# Patient Record
Sex: Female | Born: 1960 | Race: White | Hispanic: No | Marital: Married | Smoking: Current every day smoker
Health system: Southern US, Community
[De-identification: ages and names within clinical notes are randomized; demographics above are authoritative.]

## PROBLEM LIST (undated history)

## (undated) DIAGNOSIS — R05 Cough: Secondary | ICD-10-CM

## (undated) DIAGNOSIS — M549 Dorsalgia, unspecified: Secondary | ICD-10-CM

## (undated) DIAGNOSIS — K219 Gastro-esophageal reflux disease without esophagitis: Secondary | ICD-10-CM

## (undated) DIAGNOSIS — J302 Other seasonal allergic rhinitis: Secondary | ICD-10-CM

## (undated) DIAGNOSIS — R059 Cough, unspecified: Secondary | ICD-10-CM

## (undated) HISTORY — DX: Cough: R05

## (undated) HISTORY — DX: Gastro-esophageal reflux disease without esophagitis: K21.9

## (undated) HISTORY — PX: NO PAST SURGERIES: SHX2092

## (undated) HISTORY — DX: Other seasonal allergic rhinitis: J30.2

## (undated) HISTORY — DX: Dorsalgia, unspecified: M54.9

## (undated) HISTORY — DX: Cough, unspecified: R05.9

---

## 2003-09-16 ENCOUNTER — Other Ambulatory Visit: Admission: RE | Admit: 2003-09-16 | Discharge: 2003-09-16 | Payer: Self-pay | Admitting: Family Medicine

## 2003-10-14 ENCOUNTER — Ambulatory Visit (HOSPITAL_COMMUNITY): Admission: RE | Admit: 2003-10-14 | Discharge: 2003-10-14 | Payer: Self-pay | Admitting: Family Medicine

## 2005-04-01 ENCOUNTER — Ambulatory Visit (HOSPITAL_COMMUNITY): Admission: RE | Admit: 2005-04-01 | Discharge: 2005-04-01 | Payer: Self-pay | Admitting: Obstetrics and Gynecology

## 2005-04-22 ENCOUNTER — Other Ambulatory Visit: Admission: RE | Admit: 2005-04-22 | Discharge: 2005-04-22 | Payer: Self-pay | Admitting: Family Medicine

## 2006-04-28 ENCOUNTER — Other Ambulatory Visit: Admission: RE | Admit: 2006-04-28 | Discharge: 2006-04-28 | Payer: Self-pay | Admitting: Family Medicine

## 2006-05-19 ENCOUNTER — Ambulatory Visit (HOSPITAL_COMMUNITY): Admission: RE | Admit: 2006-05-19 | Discharge: 2006-05-19 | Payer: Self-pay | Admitting: Family Medicine

## 2007-05-04 ENCOUNTER — Other Ambulatory Visit: Admission: RE | Admit: 2007-05-04 | Discharge: 2007-05-04 | Payer: Self-pay | Admitting: Family Medicine

## 2007-05-25 ENCOUNTER — Ambulatory Visit (HOSPITAL_COMMUNITY): Admission: RE | Admit: 2007-05-25 | Discharge: 2007-05-25 | Payer: Self-pay | Admitting: Family Medicine

## 2008-05-31 ENCOUNTER — Emergency Department (HOSPITAL_BASED_OUTPATIENT_CLINIC_OR_DEPARTMENT_OTHER): Admission: EM | Admit: 2008-05-31 | Discharge: 2008-05-31 | Payer: Self-pay | Admitting: Emergency Medicine

## 2008-12-15 ENCOUNTER — Other Ambulatory Visit: Admission: RE | Admit: 2008-12-15 | Discharge: 2008-12-15 | Payer: Self-pay | Admitting: Family Medicine

## 2008-12-27 ENCOUNTER — Ambulatory Visit (HOSPITAL_COMMUNITY): Admission: RE | Admit: 2008-12-27 | Discharge: 2008-12-27 | Payer: Self-pay | Admitting: Family Medicine

## 2010-01-10 ENCOUNTER — Other Ambulatory Visit: Admission: RE | Admit: 2010-01-10 | Discharge: 2010-01-10 | Payer: Self-pay | Admitting: Family Medicine

## 2010-01-19 ENCOUNTER — Ambulatory Visit (HOSPITAL_COMMUNITY): Admission: RE | Admit: 2010-01-19 | Discharge: 2010-01-19 | Payer: Self-pay | Admitting: Family Medicine

## 2010-09-02 ENCOUNTER — Encounter: Payer: Self-pay | Admitting: Obstetrics and Gynecology

## 2011-01-01 ENCOUNTER — Other Ambulatory Visit (HOSPITAL_COMMUNITY): Payer: Self-pay | Admitting: Family Medicine

## 2011-01-01 DIAGNOSIS — Z1231 Encounter for screening mammogram for malignant neoplasm of breast: Secondary | ICD-10-CM

## 2011-01-31 ENCOUNTER — Ambulatory Visit (HOSPITAL_COMMUNITY)
Admission: RE | Admit: 2011-01-31 | Discharge: 2011-01-31 | Disposition: A | Discharge status: Home / Self Care | Payer: PRIVATE HEALTH INSURANCE | Source: Ambulatory Visit | Attending: Family Medicine | Admitting: Family Medicine

## 2011-01-31 DIAGNOSIS — Z1231 Encounter for screening mammogram for malignant neoplasm of breast: Secondary | ICD-10-CM | POA: Insufficient documentation

## 2012-02-03 ENCOUNTER — Other Ambulatory Visit (HOSPITAL_COMMUNITY): Payer: Self-pay | Admitting: Family Medicine

## 2012-02-03 DIAGNOSIS — Z1231 Encounter for screening mammogram for malignant neoplasm of breast: Secondary | ICD-10-CM

## 2012-02-27 ENCOUNTER — Ambulatory Visit (HOSPITAL_COMMUNITY)
Admission: RE | Admit: 2012-02-27 | Discharge: 2012-02-27 | Disposition: A | Discharge status: Home / Self Care | Payer: PRIVATE HEALTH INSURANCE | Source: Ambulatory Visit | Attending: Family Medicine | Admitting: Family Medicine

## 2012-02-27 DIAGNOSIS — Z1231 Encounter for screening mammogram for malignant neoplasm of breast: Secondary | ICD-10-CM | POA: Insufficient documentation

## 2013-02-01 ENCOUNTER — Other Ambulatory Visit: Payer: Self-pay | Admitting: Family Medicine

## 2013-02-01 ENCOUNTER — Other Ambulatory Visit (HOSPITAL_COMMUNITY): Payer: Self-pay | Admitting: Family Medicine

## 2013-02-01 ENCOUNTER — Other Ambulatory Visit (HOSPITAL_COMMUNITY)
Admission: RE | Admit: 2013-02-01 | Discharge: 2013-02-01 | Disposition: A | Discharge status: Home / Self Care | Payer: PRIVATE HEALTH INSURANCE | Source: Ambulatory Visit | Attending: Family Medicine | Admitting: Family Medicine

## 2013-02-01 DIAGNOSIS — Z1231 Encounter for screening mammogram for malignant neoplasm of breast: Secondary | ICD-10-CM

## 2013-02-01 DIAGNOSIS — Z01419 Encounter for gynecological examination (general) (routine) without abnormal findings: Secondary | ICD-10-CM | POA: Insufficient documentation

## 2013-02-19 ENCOUNTER — Other Ambulatory Visit: Payer: Self-pay | Admitting: Surgery

## 2013-03-01 ENCOUNTER — Ambulatory Visit (HOSPITAL_COMMUNITY)
Admission: RE | Admit: 2013-03-01 | Discharge: 2013-03-01 | Disposition: A | Discharge status: Home / Self Care | Payer: PRIVATE HEALTH INSURANCE | Source: Ambulatory Visit | Attending: Family Medicine | Admitting: Family Medicine

## 2013-03-01 DIAGNOSIS — Z1231 Encounter for screening mammogram for malignant neoplasm of breast: Secondary | ICD-10-CM | POA: Insufficient documentation

## 2013-11-29 ENCOUNTER — Ambulatory Visit
Admission: RE | Admit: 2013-11-29 | Discharge: 2013-11-29 | Disposition: A | Discharge status: Home / Self Care | Payer: PRIVATE HEALTH INSURANCE | Source: Ambulatory Visit | Attending: Family Medicine | Admitting: Family Medicine

## 2013-11-29 ENCOUNTER — Other Ambulatory Visit: Payer: Self-pay | Admitting: Family Medicine

## 2013-11-29 DIAGNOSIS — R05 Cough: Secondary | ICD-10-CM

## 2013-11-29 DIAGNOSIS — R053 Chronic cough: Secondary | ICD-10-CM

## 2013-12-16 ENCOUNTER — Encounter: Payer: Self-pay | Admitting: *Deleted

## 2013-12-17 ENCOUNTER — Encounter: Payer: Self-pay | Admitting: Pulmonary Disease

## 2013-12-17 ENCOUNTER — Ambulatory Visit (INDEPENDENT_AMBULATORY_CARE_PROVIDER_SITE_OTHER): Payer: No Typology Code available for payment source | Admitting: Pulmonary Disease

## 2013-12-17 VITALS — BP 130/80 | HR 82 | Temp 98.1°F | Ht 65.0 in | Wt 126.4 lb

## 2013-12-17 DIAGNOSIS — R053 Chronic cough: Secondary | ICD-10-CM

## 2013-12-17 DIAGNOSIS — R059 Cough, unspecified: Secondary | ICD-10-CM

## 2013-12-17 DIAGNOSIS — R05 Cough: Secondary | ICD-10-CM | POA: Insufficient documentation

## 2013-12-17 NOTE — Patient Instructions (Addendum)
Take chlorpheniramine 4mg  tabs, 2 each night at bedtime. Increase omeprazole to 40mg  one in am AND pm everyday.  Take 30min before meals. Will treat with a short course of prednisone for 8 days. Stay on qvar but use 2 inhalations each am AND pm.  Rinse well after using.  You must stop smoking if you want to stay well.

## 2013-12-17 NOTE — Progress Notes (Signed)
   Subjective:    Patient ID: Kendra Olson, female    DOB: 1961/01/31, 53 y.o.   MRN: [id number]  HPI The patient is a 53 year old female who I've been asked to see for persistent cough. The patient has a long-standing history of smoking, and has not had recent spirometry. She has had a recent chest x-ray last month that was without acute process. She tells me that she typically will develop increasing sinus symptoms sinus and chest congestion and cough starting every November, and this will typically go through to March. This year, she began to have the same symptoms in January, and has been treated with antibiotics in January and March. Her symptoms would improve after taking the antibiotics, and then would return. The patient feels congestion in her sinuses and chest, and also has had sore throat. Her tickling cough starts near bedtime, and then she can awaken throughout the night with a cough productive of large quantities of nonpurulent mucus.  She is unsure whether the mucus is coming from her sinuses and draining down, or coming from her chest. She has very little cough during the day with only scant mucus. She currently has no postnasal drip or sinus pressure. She does have some shortness of breath with heavy exertion over the last few weeks. She has been treated for reflux with once a day proton pump inhibitor, and initially had some improvement, she denies any heartburn symptoms at this time. She was seen by otolaryngology in January of this year, and apparently had a CT of her sinuses that was unremarkable. She also had allergy testing in 2013 that was unremarkable according to the patient. She has been on Qvar for the last month, and has seen no change.   Review of Systems  Constitutional: Negative for fever and unexpected weight change.  HENT: Positive for congestion, sneezing and sore throat. Negative for dental problem, ear pain, nosebleeds, postnasal drip, rhinorrhea, sinus pressure and  trouble swallowing.   Eyes: Negative for redness and itching.  Respiratory: Positive for cough and shortness of breath. Negative for chest tightness and wheezing.   Cardiovascular: Negative for palpitations and leg swelling.  Gastrointestinal: Negative for nausea and vomiting.  Genitourinary: Negative for dysuria.  Musculoskeletal: Negative for joint swelling.  Skin: Negative for rash.  Neurological: Negative for headaches.  Hematological: Does not bruise/bleed easily.  Psychiatric/Behavioral: Negative for dysphoric mood. The patient is not nervous/anxious.        Objective:   Physical Exam Constitutional:  Well developed, no acute distress  HENT:  Nares patent without discharge, mildly erythematous  Oropharynx without exudate, palate and uvula are normal  Eyes:  Perrla, eomi, no scleral icterus  Neck:  No JVD, no TMG  Cardiovascular:  Normal rate, regular rhythm, no rubs or gallops.  No murmurs        Intact distal pulses  Pulmonary :  Mildly decreased breath sounds, no stridor or respiratory distress   No rales, rhonchi, or wheezing  Abdominal:  Soft, nondistended, bowel sounds present.  No tenderness noted.   Musculoskeletal:  No lower extremity edema noted.  Lymph Nodes:  No cervical lymphadenopathy noted  Skin:  No cyanosis noted  Neurologic:  Alert, appropriate, moves all 4 extremities without obvious deficit.         Assessment & Plan:

## 2013-12-17 NOTE — Assessment & Plan Note (Signed)
The patient has a chronic cough that I suspect is multifactorial. It is very difficult to try and figure out how much of this is upper airway versus lower airway. She did not have an abnormal CT of her sinuses according to the patient, but does continue to have significant postnasal drip despite a daily antihistamine and nasal sprays in the past. She denies any history of reflux, but clearly states her symptoms greatly worsen upon lying down at night.  This is very suspicious for reflux, and I've asked her to increase her proton pump inhibitor to twice a day dosing. Finally, she has mild airflow obstruction on her spirometry today, and it is unclear if this is fixed or if it is simply airway inflammation from her ongoing smoking. I have explained that smoking causes mucus production from both the sinuses and the lower airways, and that it is imperative she stop if she wants to stay well.  I will treat her with a short course of prednisone to try and treat the inflammation aggressively, but I've also asked her to continue on Qvar twice a day. She is to call me in 2 weeks with her response to therapy.

## 2013-12-22 ENCOUNTER — Telehealth: Payer: Self-pay | Admitting: Pulmonary Disease

## 2013-12-22 DIAGNOSIS — R059 Cough, unspecified: Secondary | ICD-10-CM

## 2013-12-22 DIAGNOSIS — R05 Cough: Secondary | ICD-10-CM

## 2013-12-22 MED ORDER — PREDNISONE 10 MG PO TABS: ORAL_TABLET | ORAL | Status: DC

## 2013-12-22 MED ORDER — ESOMEPRAZOLE MAGNESIUM 40 MG PO CPDR: 40.0000 mg | DELAYED_RELEASE_CAPSULE | Freq: Two times a day (BID) | ORAL | Status: DC

## 2013-12-22 MED ORDER — BECLOMETHASONE DIPROPIONATE 40 MCG/ACT IN AERS: 2.0000 | INHALATION_SPRAY | Freq: Two times a day (BID) | RESPIRATORY_TRACT | Status: DC

## 2013-12-22 NOTE — Telephone Encounter (Signed)
Last OV 12/17/13: Take chlorpheniramine 4mg  tabs, 2 each night at bedtime. Increase omeprazole to 40mg  one in am AND pm everyday.  Take 30min before meals. Will treat with a short course of prednisone for 8 days. Stay on qvar but use 2 inhalations each am AND pm.  Rinse well after using.   You must stop smoking if you want to stay well ----  Spoke with pt. RX's were never sent in. I looked in epic and never see correct RX's sent in. I have done so. Pt aware and nothing needed

## 2013-12-31 ENCOUNTER — Telehealth: Payer: Self-pay | Admitting: Pulmonary Disease

## 2013-12-31 NOTE — Telephone Encounter (Signed)
Spoke with the pt  She is calling to report that her cough has resolved! Still taking chlopheniramine at night and qvar as directed  She states that taking omeprazole bid causes leg cramps, and so she started taking just 1 in the am and a zantac at night and cramps are gone  She states that her dry mouth is better, but still has bitter taste in her mouth  Pt aware KC out of the office until next  Please advise thanks!

## 2014-01-04 NOTE — Telephone Encounter (Signed)
Stay on qvar and quit smoking. Can change chlorpheniramine at night to as needed for allergy symptoms and postnasal drip Stay on omeprazole in am and zantac at night. Keep mouth rinsed well after using qvar.   followup with me again in 4-6 weeks, then will come up with a long term plan.

## 2014-01-04 NOTE — Telephone Encounter (Signed)
LMOM x 1 

## 2014-01-05 NOTE — Telephone Encounter (Signed)
Pt aware of recs from [facility] and has been scheduled for follow up visit with [facility] on 02-08-14 at 3:30pm. Nothing more needed at this time.

## 2014-01-05 NOTE — Telephone Encounter (Signed)
Pt returning call cn b reached @ [phone].Caren Griffins

## 2014-01-25 ENCOUNTER — Other Ambulatory Visit (HOSPITAL_COMMUNITY): Payer: Self-pay | Admitting: Family Medicine

## 2014-01-25 DIAGNOSIS — Z1231 Encounter for screening mammogram for malignant neoplasm of breast: Secondary | ICD-10-CM

## 2014-02-08 ENCOUNTER — Ambulatory Visit: Payer: No Typology Code available for payment source | Admitting: Pulmonary Disease

## 2014-02-14 ENCOUNTER — Encounter: Payer: Self-pay | Admitting: Pulmonary Disease

## 2014-02-14 ENCOUNTER — Ambulatory Visit (INDEPENDENT_AMBULATORY_CARE_PROVIDER_SITE_OTHER): Payer: No Typology Code available for payment source | Admitting: Pulmonary Disease

## 2014-02-14 VITALS — BP 120/86 | HR 85 | Temp 96.9°F | Ht 65.0 in | Wt 126.6 lb

## 2014-02-14 DIAGNOSIS — R053 Chronic cough: Secondary | ICD-10-CM

## 2014-02-14 DIAGNOSIS — R05 Cough: Secondary | ICD-10-CM

## 2014-02-14 DIAGNOSIS — J449 Chronic obstructive pulmonary disease, unspecified: Secondary | ICD-10-CM

## 2014-02-14 DIAGNOSIS — R059 Cough, unspecified: Secondary | ICD-10-CM

## 2014-02-14 NOTE — Assessment & Plan Note (Signed)
The patient's cough has totally resolved, and it is unclear how much of this was related to reflux, postnasal drip, or just chronic airway inflammation.

## 2014-02-14 NOTE — Progress Notes (Signed)
   Subjective:    Patient ID: Kendra BarcelonaSuzanne Putz, female    DOB: 1961-05-14, 53 y.o.   MRN: [id number]  HPI The patient comes in today for followup of her cough and also mild airflow obstruction. Unfortunately she continues to smoke, but tells me that her cough has totally resolved. Is unclear how much of this improved with treatment of reflux, postnasal drip, or airway inflammation with prednisone. She continues to have some reflux if she does not take a proton pump inhibitor.   Review of Systems  Constitutional: Negative for fever and unexpected weight change.  HENT: Negative for congestion, dental problem, ear pain, nosebleeds, postnasal drip, rhinorrhea, sinus pressure, sneezing, sore throat and trouble swallowing.   Eyes: Negative for redness and itching.  Respiratory: Positive for cough. Negative for chest tightness, shortness of breath and wheezing.   Cardiovascular: Negative for palpitations and leg swelling.  Gastrointestinal: Negative for nausea and vomiting.  Genitourinary: Negative for dysuria.  Musculoskeletal: Negative for joint swelling.  Skin: Negative for rash.  Neurological: Negative for headaches.  Hematological: Does not bruise/bleed easily.  Psychiatric/Behavioral: Negative for dysphoric mood. The patient is not nervous/anxious.        Objective:   Physical Exam Thin female in no acute distress Nose without purulence or discharge noted Neck without lymphadenopathy or thyromegaly Chest totally clear to auscultation, no wheezing Cardiac exam with regular rate and rhythm Lower extremities without edema, no cyanosis Alert and oriented, moves all 4 extremities.       Assessment & Plan:

## 2014-02-14 NOTE — Patient Instructions (Addendum)
Continue once a day omeprazole, but if your reflux symptoms worsen, you can increase to twice a day until better. Would prefer you stay on qvar twice a day until you are able to quit smoking, then can probably discontinue Stay on your antihistamine at bedtime as needed. followup with me again as needed.  Will send a note to your primary doctor about all of this.

## 2014-02-14 NOTE — Assessment & Plan Note (Signed)
The patient has mild airflow obstruction on spirometry that I suspect is related to chronic airway inflammation from her ongoing smoking. I cannot exclude the possibility of fixed airflow obstruction from emphysema, but even that this is the case, it is only mildly reduced. I have stressed to her the importance of total smoking cessation, and asked her to continue to Qvar and told she is able to quit entirely. Hopefully this will keep her from having recurrent flareups of acute asthmatic bronchitis.

## 2014-03-03 ENCOUNTER — Ambulatory Visit (HOSPITAL_COMMUNITY): Payer: No Typology Code available for payment source

## 2014-03-03 ENCOUNTER — Ambulatory Visit (HOSPITAL_COMMUNITY)
Admission: RE | Admit: 2014-03-03 | Discharge: 2014-03-03 | Disposition: A | Discharge status: Home / Self Care | Payer: No Typology Code available for payment source | Source: Ambulatory Visit | Attending: Family Medicine | Admitting: Family Medicine

## 2014-03-03 DIAGNOSIS — Z1231 Encounter for screening mammogram for malignant neoplasm of breast: Secondary | ICD-10-CM | POA: Insufficient documentation

## 2015-02-15 ENCOUNTER — Other Ambulatory Visit (HOSPITAL_COMMUNITY): Payer: Self-pay | Admitting: Family Medicine

## 2015-02-15 DIAGNOSIS — Z1231 Encounter for screening mammogram for malignant neoplasm of breast: Secondary | ICD-10-CM

## 2015-03-13 ENCOUNTER — Ambulatory Visit (HOSPITAL_COMMUNITY)
Admission: RE | Admit: 2015-03-13 | Discharge: 2015-03-13 | Disposition: A | Discharge status: Home / Self Care | Payer: Federal, State, Local not specified - PPO | Source: Ambulatory Visit | Attending: Family Medicine | Admitting: Family Medicine

## 2015-03-13 DIAGNOSIS — Z1231 Encounter for screening mammogram for malignant neoplasm of breast: Secondary | ICD-10-CM | POA: Diagnosis not present

## 2015-05-20 IMAGING — CR DG CHEST 2V
2 series · 2 of 2 positions shown · non-contrast
Comparison: None.

CLINICAL DATA: Chronic cough and shortness of breath.

EXAM:
CHEST  2 VIEW

[w chest pa]
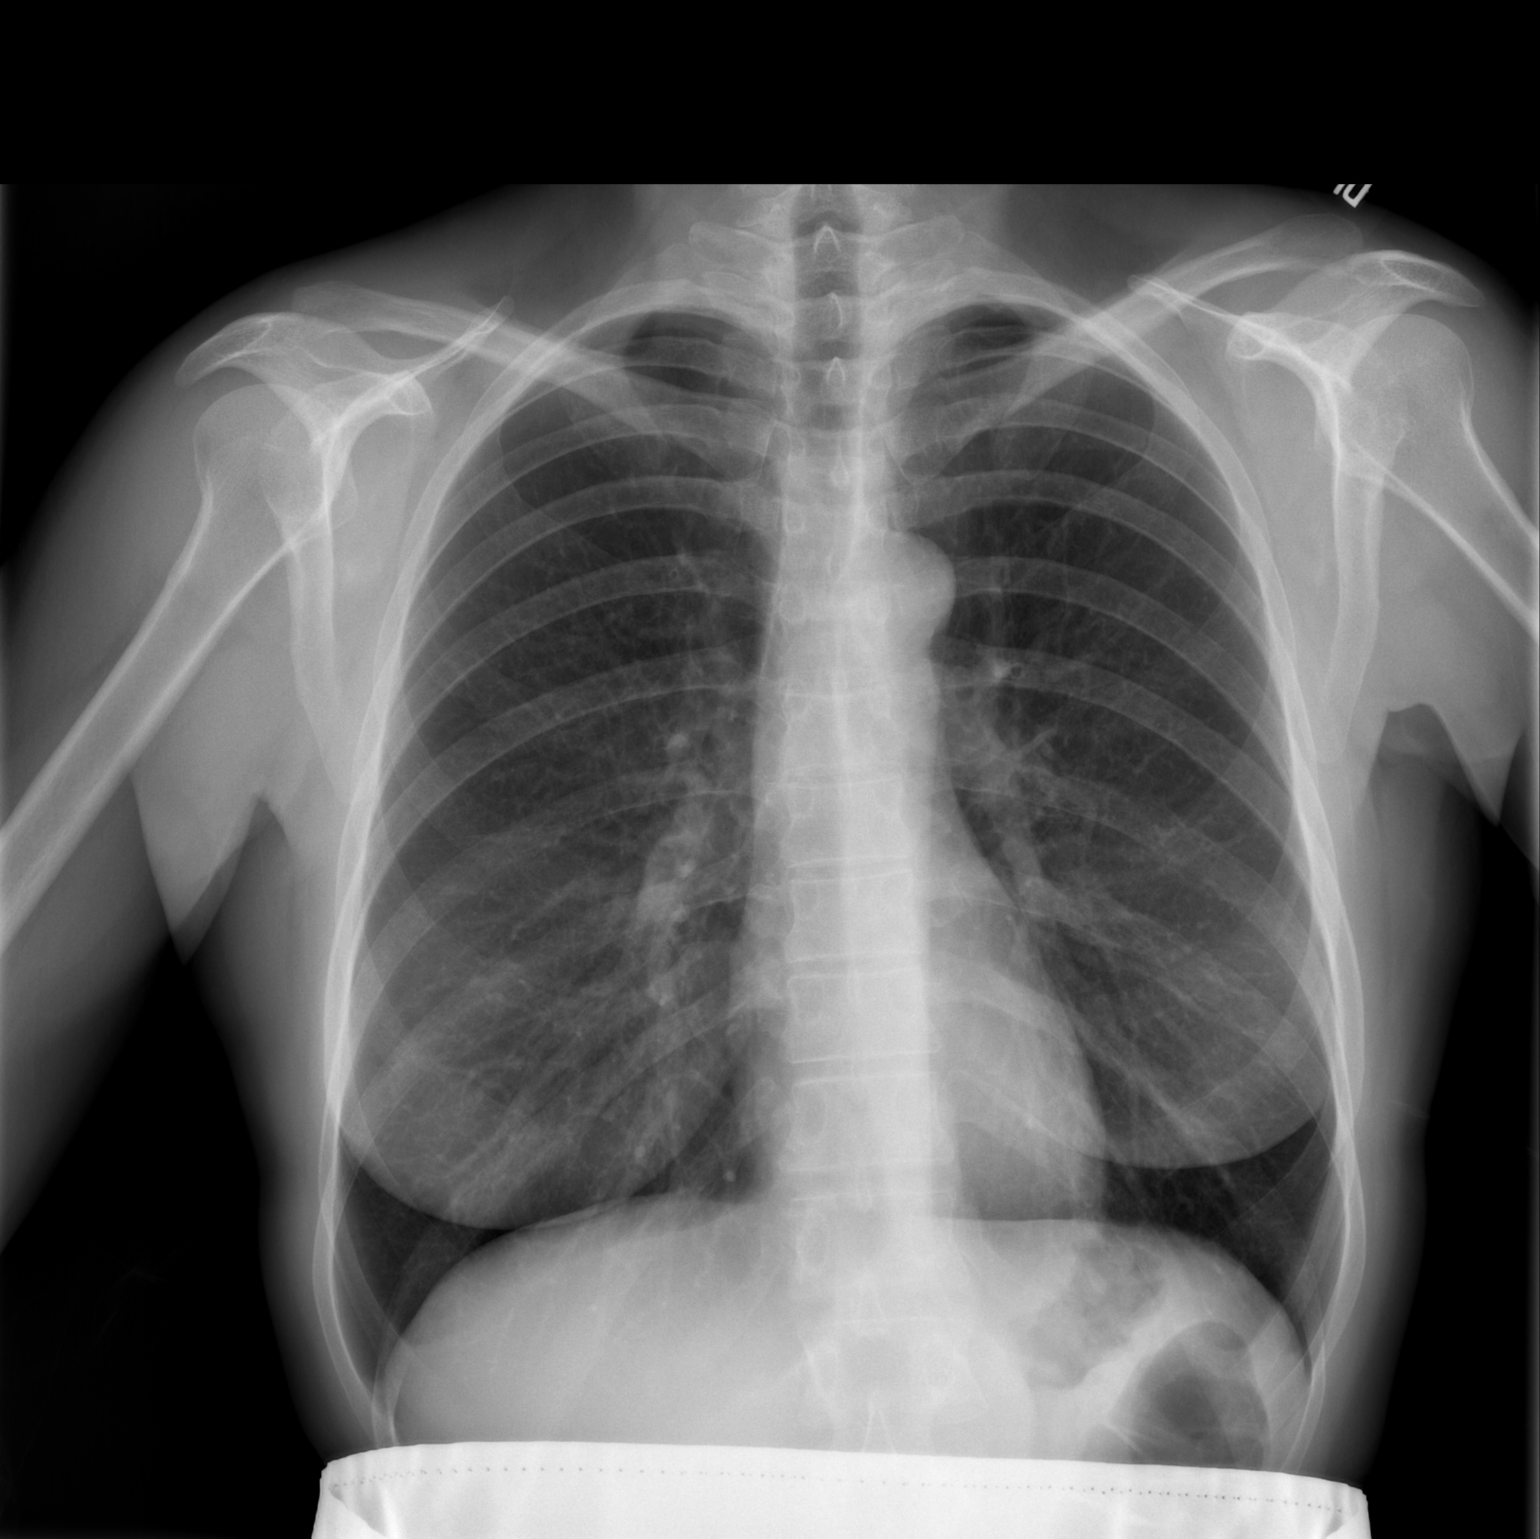

[w chest lat]
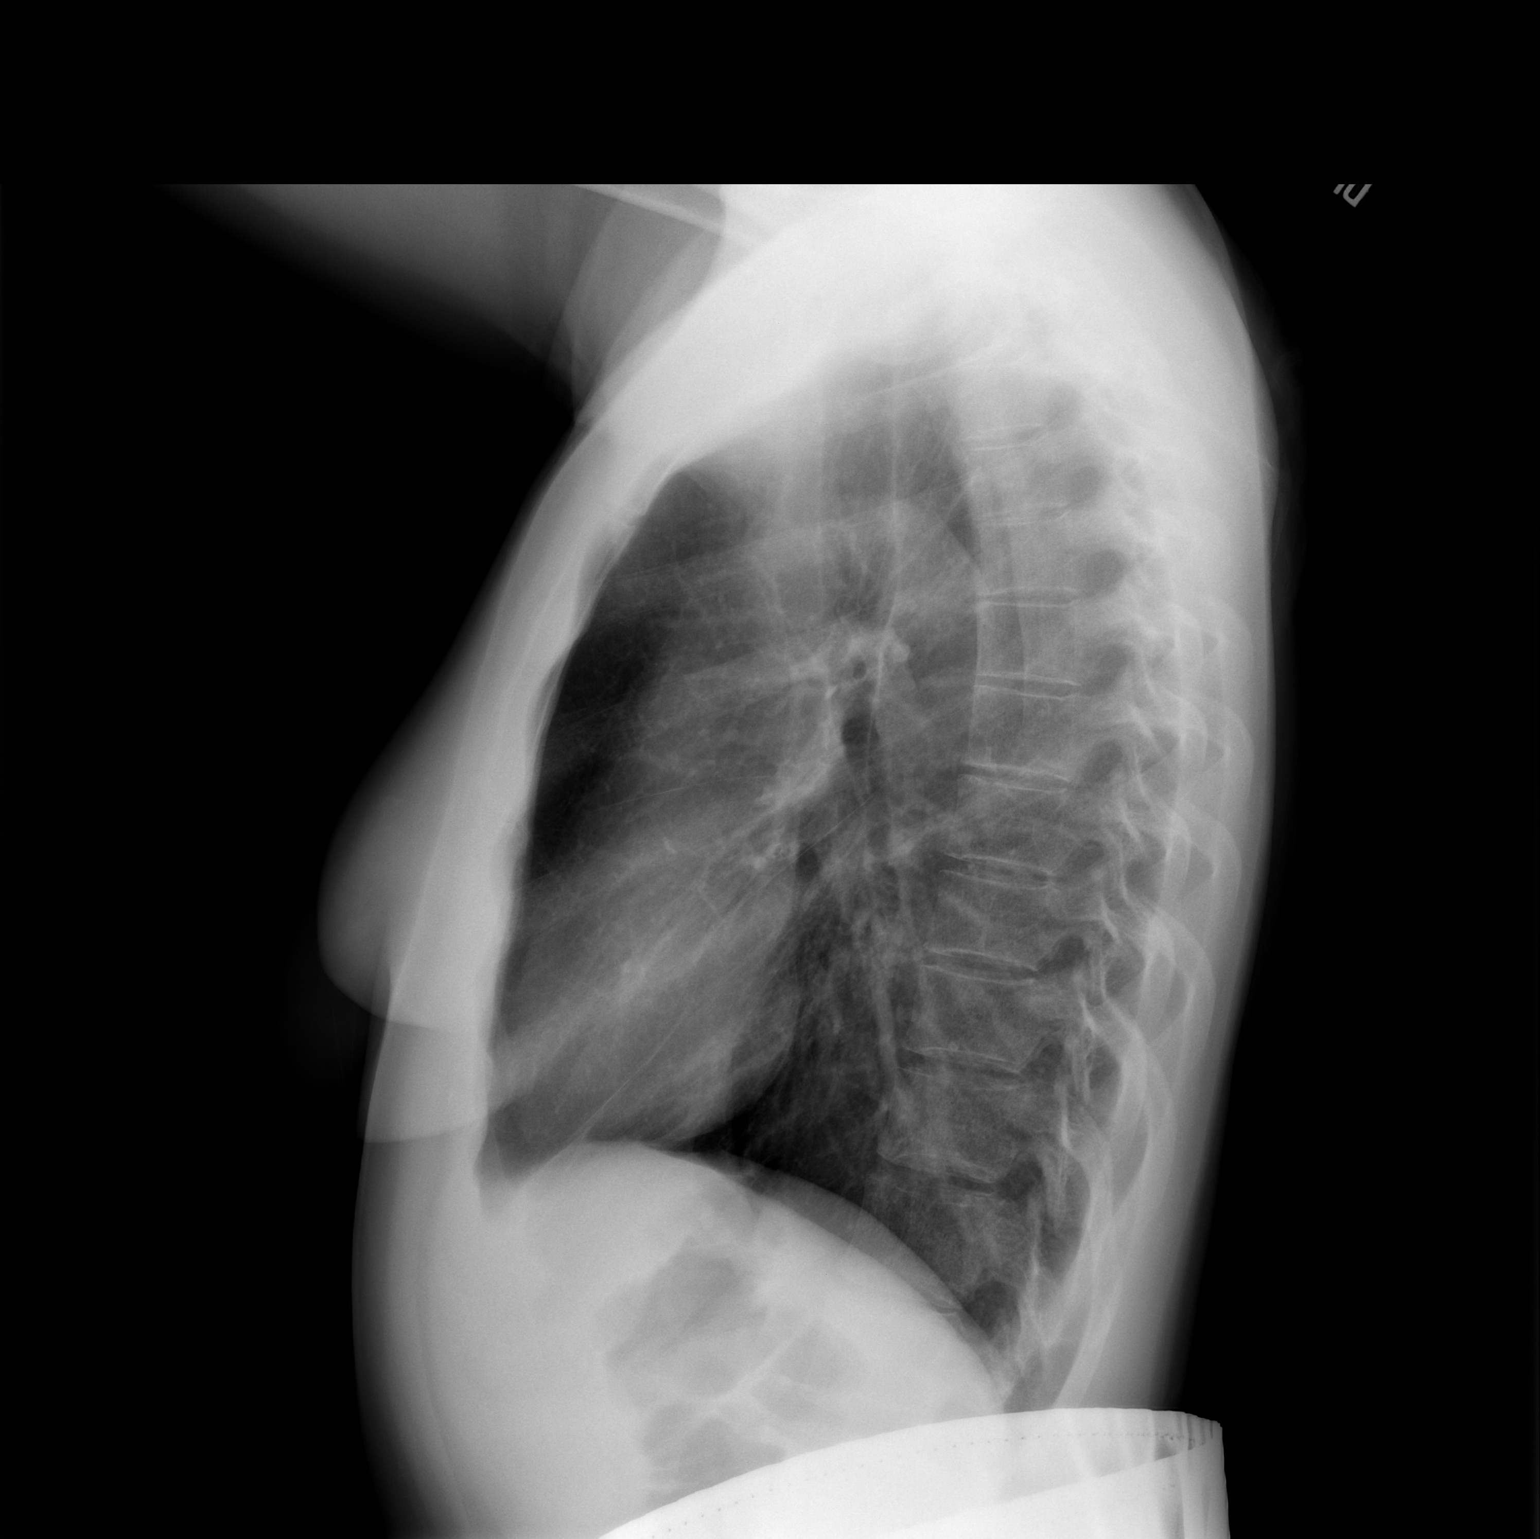

[2 of 2 positions shown; findings below may reference images not displayed]

FINDINGS: The cardiomediastinal silhouette is within normal limits. The lungs
are well inflated and clear. There is no evidence of pleural
effusion or pneumothorax. No acute osseous abnormality is
identified.
IMPRESSION: No active cardiopulmonary disease.

## 2016-02-20 ENCOUNTER — Other Ambulatory Visit: Payer: Self-pay | Admitting: Family Medicine

## 2016-02-20 ENCOUNTER — Other Ambulatory Visit (HOSPITAL_COMMUNITY)
Admission: RE | Admit: 2016-02-20 | Discharge: 2016-02-20 | Disposition: A | Discharge status: Home / Self Care | Payer: No Typology Code available for payment source | Source: Ambulatory Visit | Attending: Family Medicine | Admitting: Family Medicine

## 2016-02-20 DIAGNOSIS — Z1231 Encounter for screening mammogram for malignant neoplasm of breast: Secondary | ICD-10-CM

## 2016-02-20 DIAGNOSIS — Z124 Encounter for screening for malignant neoplasm of cervix: Secondary | ICD-10-CM | POA: Diagnosis not present

## 2016-02-21 LAB — CYTOLOGY - PAP

## 2016-03-13 ENCOUNTER — Ambulatory Visit: Payer: No Typology Code available for payment source

## 2016-03-18 ENCOUNTER — Ambulatory Visit: Payer: No Typology Code available for payment source

## 2016-03-25 ENCOUNTER — Ambulatory Visit
Admission: RE | Admit: 2016-03-25 | Discharge: 2016-03-25 | Disposition: A | Discharge status: Home / Self Care | Payer: No Typology Code available for payment source | Source: Ambulatory Visit | Attending: Family Medicine | Admitting: Family Medicine

## 2016-03-25 DIAGNOSIS — Z1231 Encounter for screening mammogram for malignant neoplasm of breast: Secondary | ICD-10-CM
